# Patient Record
Sex: Female | Born: 1995
Health system: Southern US, Community
[De-identification: ages and names within clinical notes are randomized; demographics above are authoritative.]

---

## 2020-09-19 ENCOUNTER — Other Ambulatory Visit (HOSPITAL_COMMUNITY): Payer: Self-pay | Admitting: Physician Assistant

## 2020-09-19 ENCOUNTER — Other Ambulatory Visit: Payer: Self-pay | Admitting: Physician Assistant

## 2020-09-19 DIAGNOSIS — Z3480 Encounter for supervision of other normal pregnancy, unspecified trimester: Secondary | ICD-10-CM

## 2020-10-28 ENCOUNTER — Ambulatory Visit: Payer: BC Managed Care – PPO

## 2020-12-02 ENCOUNTER — Other Ambulatory Visit: Payer: Self-pay

## 2020-12-02 ENCOUNTER — Ambulatory Visit
Admission: RE | Admit: 2020-12-02 | Discharge: 2020-12-02 | Disposition: A | Discharge status: Home / Self Care | Payer: BC Managed Care – PPO | Source: Ambulatory Visit | Attending: Physician Assistant | Admitting: Physician Assistant

## 2020-12-02 DIAGNOSIS — Z3480 Encounter for supervision of other normal pregnancy, unspecified trimester: Secondary | ICD-10-CM | POA: Diagnosis not present

## 2021-02-06 ENCOUNTER — Telehealth: Payer: Self-pay

## 2021-02-06 NOTE — Patient Outreach (Signed)
Care Coordination  02/06/2021  Masako Worton 06-22-1996 [id number]   Medicaid Managed Care   Unsuccessful Outreach Note  02/06/2021 Name: Charlett Merkle MRN: [id number] DOB: January 23, 1996  Referred by: [facility] Reason for referral : High Risk Managed Medicaid (MM Screen Unsuccessful Telephone Outreach)   An unsuccessful telephone outreach was attempted today. The patient was referred to the case management team for assistance with care management and care coordination.   Follow Up Plan: The care management team will reach out to the patient again over the next 7 days.   Gus Puma, BSW, [facility]  [place]  High Risk Managed Medicaid Team

## 2021-02-06 NOTE — Patient Instructions (Signed)
Visit Information  Ms. Cheryl Walker  - as a part of your Medicaid benefit, you are eligible for care management and care coordination services at no cost or copay. I was unable to reach you by phone today but would be happy to help you with your health related needs. Please feel free to call me @ [phone].   A member of the Managed Medicaid care management team will reach out to you again over the next 7 days.   Gus Puma, BSW, [facility]  [place]  High Risk Managed Medicaid Team

## 2021-02-17 ENCOUNTER — Telehealth: Payer: Self-pay

## 2021-02-17 NOTE — Patient Outreach (Signed)
Care Coordination  02/17/2021  Alia Nibert [birth year] [id number]   Medicaid Managed Care   Unsuccessful Outreach Note  02/17/2021 Name: Cheryl Walker MRN: [id number] DOB: 02/06/1996  Referred by: [facility] Reason for referral : High Risk Managed Medicaid (MM Screen unsuccessful telephone outreach)   A second unsuccessful telephone outreach was attempted today. The patient was referred to the case management team for assistance with care management and care coordination.   Follow Up Plan: The care management team will reach out to the patient again over the next 7 days.   Gus Puma, BSW, [facility]  [place]  High Risk Managed Medicaid Team

## 2021-02-17 NOTE — Patient Instructions (Signed)
Visit Information  Ms. Maddyn Peach  - as a part of your Medicaid benefit, you are eligible for care management and care coordination services at no cost or copay. I was unable to reach you by phone today but would be happy to help you with your health related needs. Please feel free to call me @ [phone].   A member of the Managed Medicaid care management team will reach out to you again over the next 7 days.   Gus Puma, BSW, [facility]  [place]  High Risk Managed Medicaid Team

## 2021-02-26 ENCOUNTER — Telehealth: Payer: Self-pay

## 2021-02-26 NOTE — Patient Outreach (Signed)
Care Coordination  02/26/2021  Kadeja Mcmichael 1996-09-14 [id number]   Medicaid Managed Care   Unsuccessful Outreach Note  02/26/2021 Name: Sharise Lippy MRN: [id number] DOB: 1996/06/28  Referred by: [facility] Reason for referral : High Risk Managed Medicaid (MM [employer] Screener unsuccessful telephone Outreach)   Third unsuccessful telephone outreach was attempted today. The patient was referred to the case management team for assistance with care management and care coordination. The patient's primary care provider has been notified of our unsuccessful attempts to make or maintain contact with the patient. The care management team is pleased to engage with this patient at any time in the future should he/she be interested in assistance from the care management team.   Follow Up Plan: The patient has been provided with contact information for the care management team and has been advised to call with any health related questions or concerns.   Gus Puma, BSW, [facility]  [employer] Risk Managed Medicaid Team  [phone]

## 2021-02-26 NOTE — Patient Instructions (Signed)
Visit Information  Ms. Cheryl Walker  - as a part of your Medicaid benefit, you are eligible for care management and care coordination services at no cost or copay. I was unable to reach you by phone today but would be happy to help you with your health related needs. Please feel free to call me @ [phone].     Gus Puma, BSW, [facility]  [employer] Risk Managed Medicaid Team  [phone]

## 2022-03-19 IMAGING — US US OB COMP +14 WK
1 series · 13 of 28 positions shown · non-contrast
Comparison: none

CLINICAL DATA: 24-year-old pregnant female with no previous imaging
in the node is states for this gestation. EDC by assigned dates:
01/05/2021, projecting to an expected gestational age of 35 weeks 1
day.

EXAM:
OBSTETRICAL ULTRASOUND >14 WKS

[Series 1: us ob comp +14 wk · 0.30mm/px · 61 acquisitions, 13 frames shown]
[im 3/61]
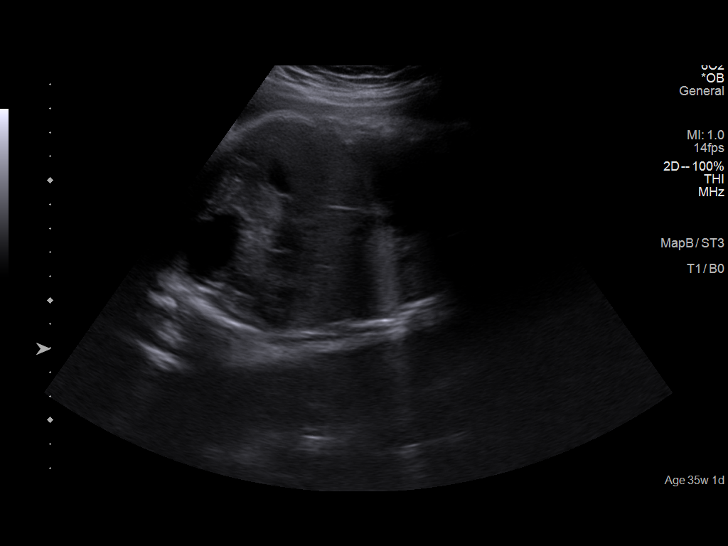
[im 7/61]
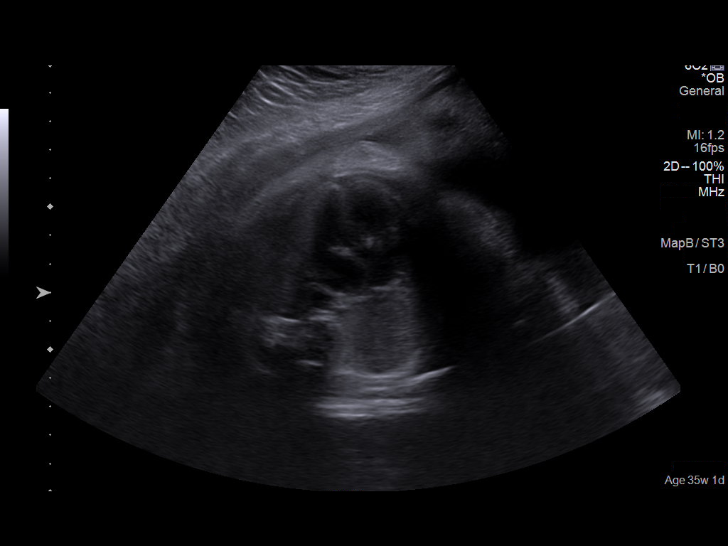
[im 12/61]
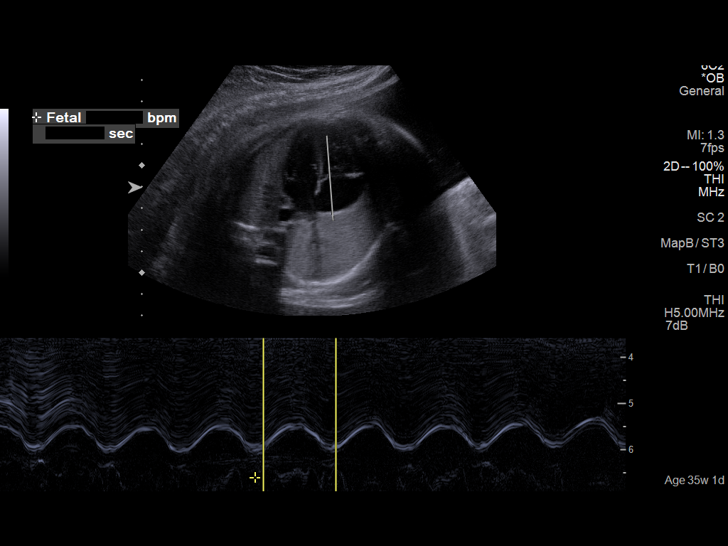
[im 16/61]
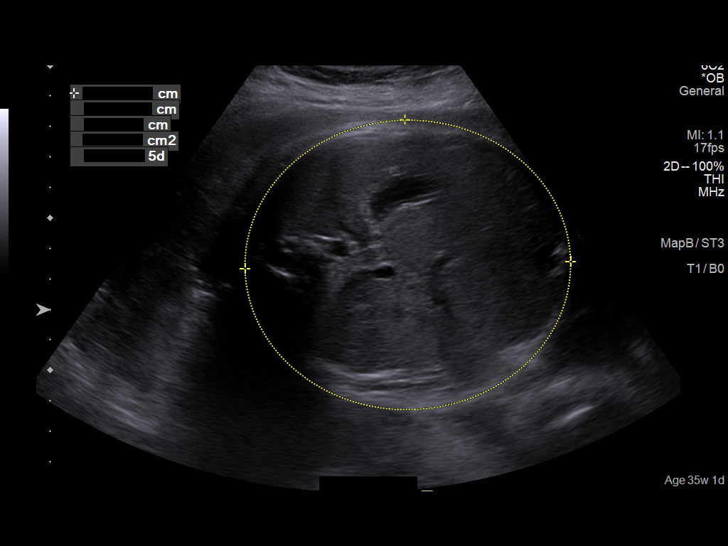
[im 21/61]
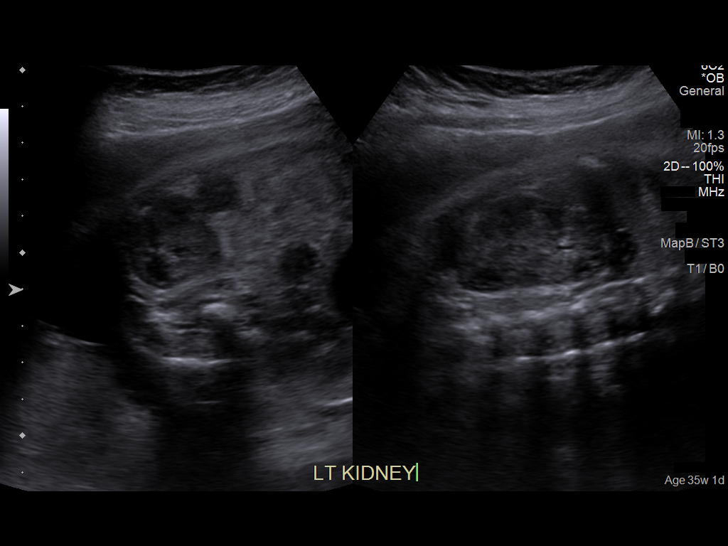
[im 25/61]
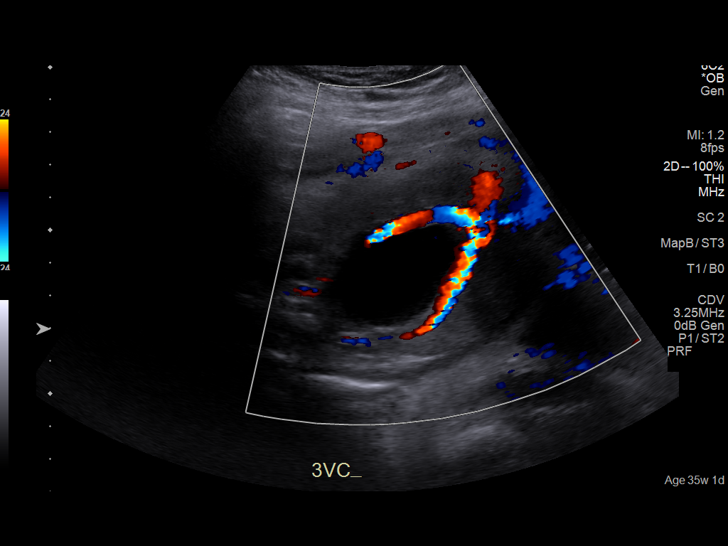
[im 32/61]
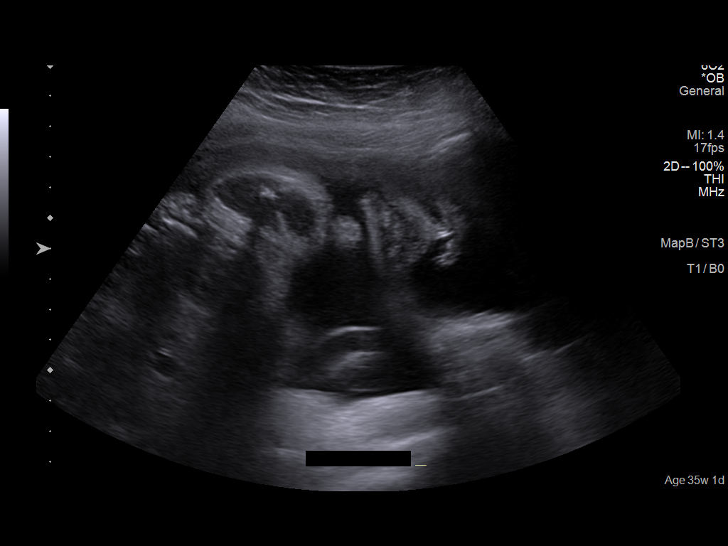
[im 36/61]
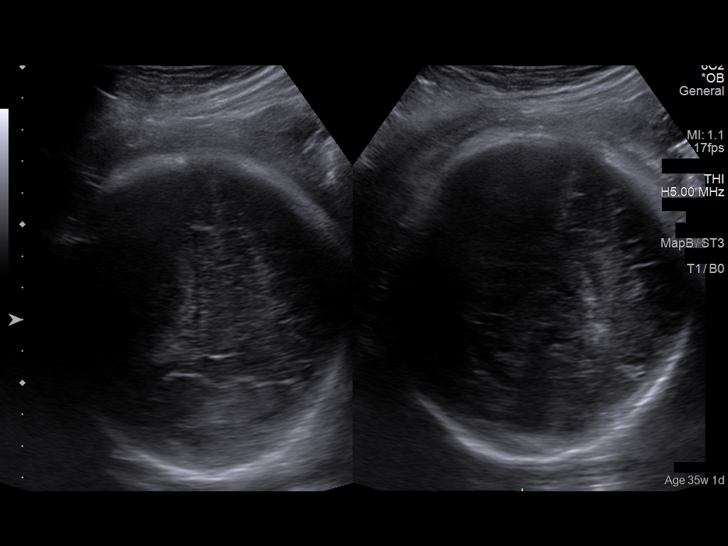
[im 41/61]
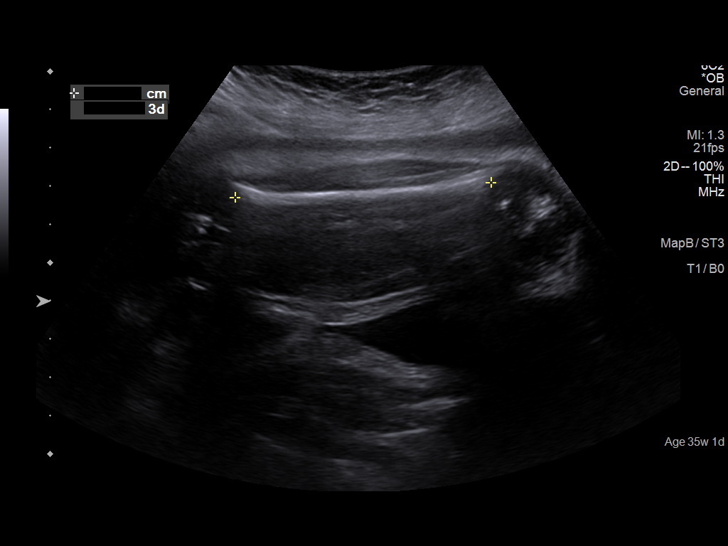
[im 45/61]
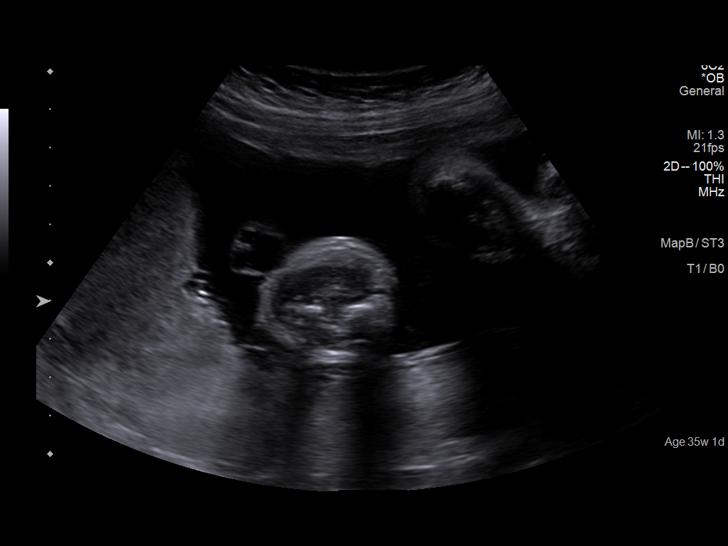
[im 49/61]
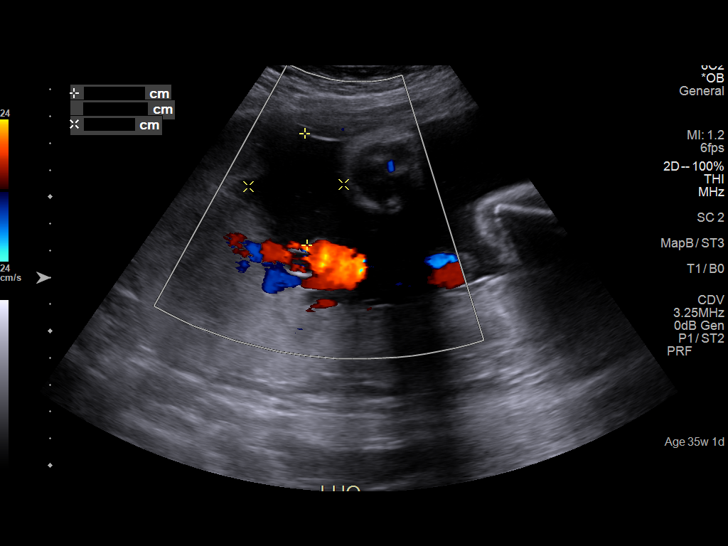
[im 54/61]
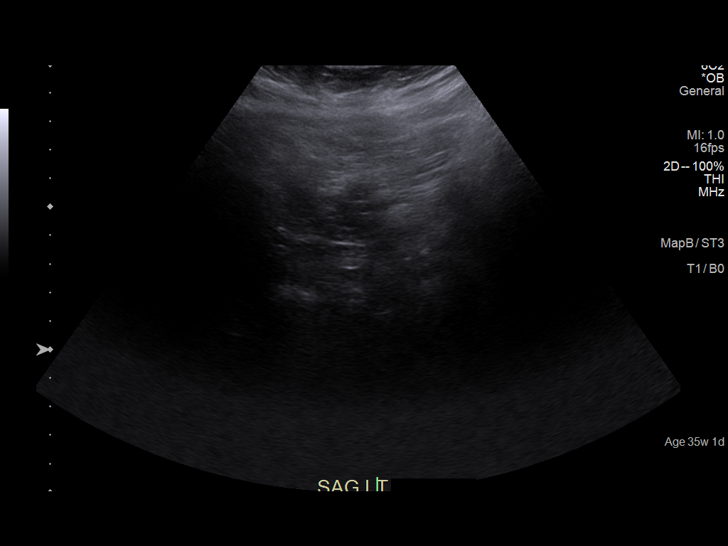
[im 58/61]
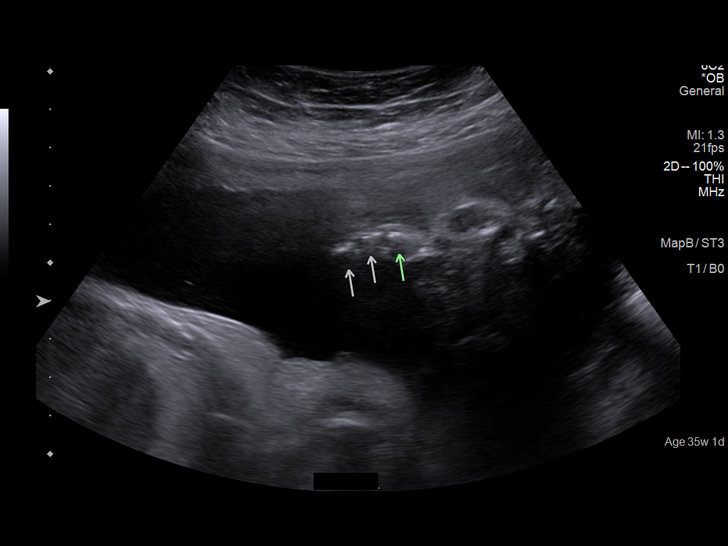

[13 of 28 positions shown; findings below may reference images not displayed]

FINDINGS: Number of Fetuses: 1

Heart Rate:  141 bpm

Movement: Yes

Presentation: Cephalic

Previa: No

Placental Location: Lateral Right

Amniotic Fluid (Subjective): Normal

Amniotic Fluid (Objective):

Vertical pocket = 4.2cm

AFI = 12.9 cm (5%ile= 7.9 cm, 95%= 24.9 cm for a 35 wks)

FETAL BIOMETRY

BPD: 8.9cm 35w 6d

HC:   32.0cm 36w 0d

AC:   31.8cm 35w 5d

FL:   6.7cm 34w 3d

Current Mean GA: 35w 2d US EDC: 01/04/2021

Assigned GA:  35w 1d Assigned EDC: 01/05/2021

Estimated Fetal Weight:  2,676g 56%ile

FETAL ANATOMY

Lateral Ventricles: Appears normal

Thalami/CSP: Appears normal

Posterior Fossa:  Not visualized

Nuchal Region: Not visualized   NFT= N/A > 20 WKS

Upper Lip: Appears normal

Spine: Not visualized

4 Chamber Heart on Left: Appears normal

LVOT: Appears normal

RVOT: Appears normal

Stomach on Left: Appears normal

3 Vessel Cord: Appears normal

Cord Insertion site: Appears normal

Kidneys: Appears normal

Bladder: Appears normal

Extremities: Appears normal, 4 extremities demonstrated

Sex: Not Visualized

Technically difficult due to: Advanced gestational age

Maternal Findings:

Cervix:  Not evaluated on these transabdominal images.
IMPRESSION: 1. Single living intrauterine gestation in cephalic lie at 35 weeks
2 days by average ultrasound age, concordant with assigned dating.
2. Estimated fetal weight 2171 g, at the 56th percentile for
expected gestational age.
3. Normal amniotic fluid volume.  AFI 12.9.
4. No fetal or maternal abnormalities detected, with limitations as
detailed.
